# Patient Record
Sex: Female | Born: 1960 | Race: White | Hispanic: No | Marital: Married | Smoking: Never smoker
Health system: Southern US, Community
[De-identification: ages and names within clinical notes are randomized; demographics above are authoritative.]

## PROBLEM LIST (undated history)

## (undated) DIAGNOSIS — K5190 Ulcerative colitis, unspecified, without complications: Secondary | ICD-10-CM

## (undated) DIAGNOSIS — K8309 Other cholangitis: Secondary | ICD-10-CM

## (undated) HISTORY — PX: TRACHEOSTOMY: SUR1362

## (undated) HISTORY — PX: FEMUR FRACTURE SURGERY: SHX633

## (undated) HISTORY — PX: WRIST SURGERY: SHX841

## (undated) HISTORY — PX: TOTAL HIP ARTHROPLASTY: SHX124

---

## 2009-02-10 ENCOUNTER — Encounter: Payer: Self-pay | Admitting: Family Medicine

## 2009-02-26 ENCOUNTER — Encounter: Payer: Self-pay | Admitting: Family Medicine

## 2009-03-28 ENCOUNTER — Encounter: Payer: Self-pay | Admitting: Family Medicine

## 2009-05-01 ENCOUNTER — Ambulatory Visit: Payer: Self-pay

## 2010-04-01 ENCOUNTER — Emergency Department: Payer: Self-pay | Admitting: Emergency Medicine

## 2015-07-22 DIAGNOSIS — K7460 Unspecified cirrhosis of liver: Secondary | ICD-10-CM | POA: Diagnosis not present

## 2015-07-22 DIAGNOSIS — K830 Cholangitis: Secondary | ICD-10-CM | POA: Diagnosis not present

## 2015-07-22 DIAGNOSIS — K766 Portal hypertension: Secondary | ICD-10-CM | POA: Diagnosis not present

## 2015-09-16 DIAGNOSIS — K3189 Other diseases of stomach and duodenum: Secondary | ICD-10-CM | POA: Diagnosis not present

## 2015-09-16 DIAGNOSIS — I8500 Esophageal varices without bleeding: Secondary | ICD-10-CM | POA: Diagnosis not present

## 2015-09-16 DIAGNOSIS — K5150 Left sided colitis without complications: Secondary | ICD-10-CM | POA: Diagnosis not present

## 2015-09-16 DIAGNOSIS — Z1211 Encounter for screening for malignant neoplasm of colon: Secondary | ICD-10-CM | POA: Diagnosis not present

## 2015-09-16 DIAGNOSIS — K51918 Ulcerative colitis, unspecified with other complication: Secondary | ICD-10-CM | POA: Diagnosis not present

## 2015-10-28 DIAGNOSIS — K3189 Other diseases of stomach and duodenum: Secondary | ICD-10-CM | POA: Diagnosis not present

## 2015-10-28 DIAGNOSIS — I8500 Esophageal varices without bleeding: Secondary | ICD-10-CM | POA: Diagnosis not present

## 2015-11-19 DIAGNOSIS — K830 Cholangitis: Secondary | ICD-10-CM | POA: Diagnosis not present

## 2015-11-19 DIAGNOSIS — T380X1A Poisoning by glucocorticoids and synthetic analogues, accidental (unintentional), initial encounter: Secondary | ICD-10-CM | POA: Diagnosis not present

## 2015-11-19 DIAGNOSIS — I8500 Esophageal varices without bleeding: Secondary | ICD-10-CM | POA: Diagnosis not present

## 2015-11-19 DIAGNOSIS — M818 Other osteoporosis without current pathological fracture: Secondary | ICD-10-CM | POA: Diagnosis not present

## 2016-01-27 DIAGNOSIS — K7460 Unspecified cirrhosis of liver: Secondary | ICD-10-CM | POA: Diagnosis not present

## 2016-01-27 DIAGNOSIS — K228 Other specified diseases of esophagus: Secondary | ICD-10-CM | POA: Diagnosis not present

## 2016-01-27 DIAGNOSIS — Z96643 Presence of artificial hip joint, bilateral: Secondary | ICD-10-CM | POA: Diagnosis not present

## 2016-01-27 DIAGNOSIS — R1310 Dysphagia, unspecified: Secondary | ICD-10-CM | POA: Diagnosis not present

## 2016-01-27 DIAGNOSIS — I8500 Esophageal varices without bleeding: Secondary | ICD-10-CM | POA: Diagnosis not present

## 2016-01-27 DIAGNOSIS — Z8719 Personal history of other diseases of the digestive system: Secondary | ICD-10-CM | POA: Diagnosis not present

## 2016-01-27 DIAGNOSIS — K3189 Other diseases of stomach and duodenum: Secondary | ICD-10-CM | POA: Diagnosis not present

## 2016-01-27 DIAGNOSIS — K766 Portal hypertension: Secondary | ICD-10-CM | POA: Diagnosis not present

## 2016-03-02 DIAGNOSIS — M1990 Unspecified osteoarthritis, unspecified site: Secondary | ICD-10-CM | POA: Diagnosis not present

## 2016-03-02 DIAGNOSIS — I10 Essential (primary) hypertension: Secondary | ICD-10-CM | POA: Diagnosis not present

## 2016-03-02 DIAGNOSIS — L989 Disorder of the skin and subcutaneous tissue, unspecified: Secondary | ICD-10-CM | POA: Diagnosis not present

## 2016-04-22 DIAGNOSIS — Z8781 Personal history of (healed) traumatic fracture: Secondary | ICD-10-CM | POA: Diagnosis not present

## 2016-04-22 DIAGNOSIS — Z92241 Personal history of systemic steroid therapy: Secondary | ICD-10-CM | POA: Diagnosis not present

## 2016-04-22 DIAGNOSIS — M818 Other osteoporosis without current pathological fracture: Secondary | ICD-10-CM | POA: Diagnosis not present

## 2016-07-06 DIAGNOSIS — M818 Other osteoporosis without current pathological fracture: Secondary | ICD-10-CM | POA: Diagnosis not present

## 2016-07-28 DIAGNOSIS — H5213 Myopia, bilateral: Secondary | ICD-10-CM | POA: Diagnosis not present

## 2017-10-20 ENCOUNTER — Other Ambulatory Visit: Payer: Self-pay | Admitting: Family Medicine

## 2017-10-20 DIAGNOSIS — Z1231 Encounter for screening mammogram for malignant neoplasm of breast: Secondary | ICD-10-CM

## 2017-11-15 ENCOUNTER — Ambulatory Visit
Admission: RE | Admit: 2017-11-15 | Discharge: 2017-11-15 | Disposition: A | Discharge status: Home / Self Care | Payer: Commercial Managed Care - PPO | Source: Ambulatory Visit | Attending: Family Medicine | Admitting: Family Medicine

## 2017-11-15 ENCOUNTER — Encounter: Payer: Self-pay | Admitting: Radiology

## 2017-11-15 DIAGNOSIS — Z1231 Encounter for screening mammogram for malignant neoplasm of breast: Secondary | ICD-10-CM | POA: Diagnosis present

## 2021-03-25 ENCOUNTER — Other Ambulatory Visit: Payer: Self-pay | Admitting: Family Medicine

## 2021-03-25 DIAGNOSIS — Z1231 Encounter for screening mammogram for malignant neoplasm of breast: Secondary | ICD-10-CM

## 2021-04-28 ENCOUNTER — Ambulatory Visit
Admission: RE | Admit: 2021-04-28 | Discharge: 2021-04-28 | Disposition: A | Discharge status: Home / Self Care | Payer: Commercial Managed Care - PPO | Source: Ambulatory Visit | Attending: Family Medicine | Admitting: Family Medicine

## 2021-04-28 ENCOUNTER — Other Ambulatory Visit: Payer: Self-pay

## 2021-04-28 DIAGNOSIS — Z1231 Encounter for screening mammogram for malignant neoplasm of breast: Secondary | ICD-10-CM | POA: Diagnosis not present

## 2021-08-19 ENCOUNTER — Emergency Department
Admission: EM | Admit: 2021-08-19 | Discharge: 2021-08-19 | Disposition: A | Discharge status: Home / Self Care | Payer: Commercial Managed Care - PPO | Attending: Emergency Medicine | Admitting: Emergency Medicine

## 2021-08-19 ENCOUNTER — Emergency Department: Payer: Commercial Managed Care - PPO

## 2021-08-19 ENCOUNTER — Other Ambulatory Visit: Payer: Self-pay

## 2021-08-19 ENCOUNTER — Encounter: Payer: Self-pay | Admitting: Emergency Medicine

## 2021-08-19 DIAGNOSIS — I10 Essential (primary) hypertension: Secondary | ICD-10-CM | POA: Diagnosis not present

## 2021-08-19 DIAGNOSIS — R002 Palpitations: Secondary | ICD-10-CM | POA: Diagnosis present

## 2021-08-19 DIAGNOSIS — I4891 Unspecified atrial fibrillation: Secondary | ICD-10-CM | POA: Insufficient documentation

## 2021-08-19 HISTORY — DX: Ulcerative colitis, unspecified, without complications: K51.90

## 2021-08-19 HISTORY — DX: Other cholangitis: K83.09

## 2021-08-19 LAB — CBC
HCT: 39.9 % (ref 36.0–46.0)
Hemoglobin: 12.9 g/dL (ref 12.0–15.0)
MCH: 33.3 pg (ref 26.0–34.0)
MCHC: 32.3 g/dL (ref 30.0–36.0)
MCV: 103.1 fL — ABNORMAL HIGH (ref 80.0–100.0)
Platelets: 117 10*3/uL — ABNORMAL LOW (ref 150–400)
RBC: 3.87 MIL/uL (ref 3.87–5.11)
RDW: 13 % (ref 11.5–15.5)
WBC: 5.5 10*3/uL (ref 4.0–10.5)
nRBC: 0 % (ref 0.0–0.2)

## 2021-08-19 LAB — TROPONIN I (HIGH SENSITIVITY): Troponin I (High Sensitivity): 9 ng/L (ref <18)

## 2021-08-19 LAB — BASIC METABOLIC PANEL
Anion gap: 8 (ref 5–15)
BUN: 18 mg/dL (ref 6–20)
CO2: 24 mmol/L (ref 22–32)
Calcium: 8.8 mg/dL — ABNORMAL LOW (ref 8.9–10.3)
Chloride: 104 mmol/L (ref 98–111)
Creatinine, Ser: 0.75 mg/dL (ref 0.44–1.00)
GFR, Estimated: >60 mL/min (ref >60)
Glucose, Bld: 83 mg/dL (ref 70–99)
Potassium: 4.1 mmol/L (ref 3.5–5.1)
Sodium: 136 mmol/L (ref 135–145)

## 2021-08-19 MED ORDER — NADOLOL 40 MG PO TABS: 40.0000 mg | ORAL_TABLET | Freq: Two times a day (BID) | ORAL | 2 refills | Status: AC

## 2021-08-19 MED ORDER — SODIUM CHLORIDE 0.9 % IV BOLUS
1000.0000 mL | Freq: Once | INTRAVENOUS | Status: AC
Start: 2021-08-19 — End: 2021-08-19
  Administered 2021-08-19: 1000 mL via INTRAVENOUS

## 2021-08-19 MED ORDER — DILTIAZEM HCL 25 MG/5ML IV SOLN
10.0000 mg | Freq: Once | INTRAVENOUS | Status: AC
  Administered 2021-08-19: 10 mg via INTRAVENOUS
  Filled 2021-08-19: qty 5

## 2021-08-19 MED ORDER — APIXABAN 2.5 MG PO TABS: 2.5000 mg | ORAL_TABLET | Freq: Two times a day (BID) | ORAL | 2 refills | Status: AC

## 2021-08-19 MED ORDER — DILTIAZEM HCL ER COATED BEADS 180 MG PO CP24
180.0000 mg | ORAL_CAPSULE | Freq: Once | ORAL | Status: AC
  Administered 2021-08-19: 180 mg via ORAL
  Filled 2021-08-19: qty 1

## 2021-08-19 NOTE — ED Notes (Signed)
C/o some dizziness, heart racing and som sob. Denies pain or nausea.

## 2021-08-19 NOTE — ED Provider Notes (Addendum)
[facility] Provider Note    Event Date/Time   First MD Initiated Contact with Patient 08/19/21 1331     (approximate)  History   Chief Complaint: Irregular Heart Beat and Dizziness  HPI  Morgan Preston is a 61 y.o. female with no significant past medical history presents to the emergency department for palpitations and dizziness.  According to the patient since last night she has been feeling palpitations like her heart is racing in the chest.  States she has been feeling dizzy at times as well as a sensation of slight chest tightness.  Patient denies any history of irregular heartbeat in the past.  States she used to have palpitations in approximately 2 years ago had a work-up by Dr. Lady Gary of cardiology for the same with a Holter that did not show any abnormalities.  Patient states very slight chest tightness.  She is quite hypertensive 160/133 but appears quite anxious as well.  Physical Exam   Triage Vital Signs: ED Triage Vitals  Enc Vitals Group     BP 08/19/21 1313 (!) 161/133     Pulse Rate 08/19/21 1313 (!) 122     Resp 08/19/21 1313 17     Temp 08/19/21 1313 98.3 F (36.8 C)     Temp Source 08/19/21 1313 Oral     SpO2 08/19/21 1313 100 %     Weight 08/19/21 1309 162 lb (73.5 kg)     Height 08/19/21 1309 5\' 1"  (1.549 m)     Head Circumference --      Peak Flow --      Pain Score 08/19/21 1309 0     Pain Loc --      Pain Edu? --      Excl. in GC? --     Most recent vital signs: Vitals:   08/19/21 1313  BP: (!) 161/133  Pulse: (!) 122  Resp: 17  Temp: 98.3 F (36.8 C)  SpO2: 100%    General: Awake, no distress.  CV:  Good peripheral perfusion.  Irregular rhythm rate around 130 bpm. Resp:  Normal effort.  Equal breath sounds bilaterally.  Abd:  No distention.  Soft, nontender.  No rebound or guarding.    ED Results / Procedures / Treatments   EKG  EKG viewed and interpreted by myself shows atrial fibrillation at 119 bpm with  a narrow QRS, normal axis, normal intervals, no concerning ST changes.  RADIOLOGY  I personally reviewed the chest x-ray images, no significant abnormality on my evaluation. Chest x-ray read as cardiomegaly otherwise negative.   MEDICATIONS ORDERED IN ED: Medications  diltiazem (CARDIZEM) injection 10 mg (has no administration in time range)  sodium chloride 0.9 % bolus 1,000 mL (has no administration in time range)     IMPRESSION / MDM / ASSESSMENT AND PLAN / ED COURSE  I reviewed the triage vital signs and the nursing notes.  Patient presents to the emergency department for palpitations and chest tightness as well as dizziness.  Overall the patient appears well she does appear to be in atrial fibrillation around 130 bpm.  Given the patient's new onset atrial fibrillation we will dose IV fluids, 10 mg IV diltiazem.  We will check labs including cardiac enzymes and continue to closely monitor.  Patient agreeable to plan of care.  Patient's labs are reassuring.  CBC is normal.  Chemistry is normal.  Troponin is negative.  Patient's heart rate has come down from 120/130 currently around 85  to 95 bpm.  Given the patient's reassuring work-up and good response to diltiazem I spoke to Dr. Juliann Pares.  He is agreeable with plan to discharge home on Eliquis and diltiazem and have the patient follow-up in the office.  I spoke to the patient regarding this she is agreeable to this plan of care as well.  I also discussed blood thinner return precautions.  Patient has a history of primary sclerosing cholangitis.  Is on nadolol already.  Given this I spoke to Dr. Juliann Pares again.  We will hold off on diltiazem.  We will start the patient on a lower dose Eliquis 2.5 mg twice daily.  We will increase the nadolol from 40 mg daily to 40 mg twice daily have the patient follow-up with her doctor at [facility] as well.  Patient agreeable to plan.  FINAL CLINICAL IMPRESSION(S) / ED DIAGNOSES   New onset atrial  fibrillation Atrial fibrillation with rapid ventricular response  Rx / DC Orders   Nadolol Eliquis  Note:  This document was prepared using Dragon voice recognition software and may include unintentional dictation errors.   Minna Antis, MD 08/19/21 1521    Minna Antis, MD 08/19/21 [phone]

## 2021-08-19 NOTE — ED Triage Notes (Signed)
Pt comes into the ED via [facility] clinic for irregular heart rate, dizziness, and palpitations that started last night.  Pt denies any known h/o afib.  Pt rate with KC was between 90-120.  Pt also presented HTN at [facility] with it being 195/125.  Pt denies any chest pain or SHOB.  Pt denies any blood thinner use.  Pt in NAD at this time with even and unlabored respirations.

## 2021-08-19 NOTE — ED Notes (Signed)
D/c pending cards consult

## 2021-08-19 NOTE — ED Notes (Signed)
Back from [facility], family at [facility], EDP finished at [facility]. No changes. Pt alert, NAD, calm, interactive.

## 2021-08-19 NOTE — ED Notes (Addendum)
Denies questions or needs, up to [facility] prior to leaving

## 2022-03-29 ENCOUNTER — Other Ambulatory Visit: Payer: Self-pay | Admitting: Family Medicine

## 2022-03-29 DIAGNOSIS — Z1231 Encounter for screening mammogram for malignant neoplasm of breast: Secondary | ICD-10-CM

## 2022-08-03 ENCOUNTER — Ambulatory Visit
Admission: RE | Admit: 2022-08-03 | Discharge: 2022-08-03 | Disposition: A | Discharge status: Home / Self Care | Payer: Commercial Managed Care - PPO | Source: Ambulatory Visit | Attending: Family Medicine | Admitting: Family Medicine

## 2022-08-03 DIAGNOSIS — Z1231 Encounter for screening mammogram for malignant neoplasm of breast: Secondary | ICD-10-CM | POA: Diagnosis not present

## 2022-10-11 IMAGING — MG MM DIGITAL SCREENING BILAT W/ TOMO AND CAD
8 series · 9 of 24 positions shown · non-contrast
Comparison: Previous exam(s).

CLINICAL DATA: Screening.

EXAM:
DIGITAL SCREENING BILATERAL MAMMOGRAM WITH TOMOSYNTHESIS AND CAD
TECHNIQUE: Bilateral screening digital craniocaudal and mediolateral oblique
mammograms were obtained. Bilateral screening digital breast
tomosynthesis was performed. The images were evaluated with
computer-aided detection.

[R CC synth-2D]
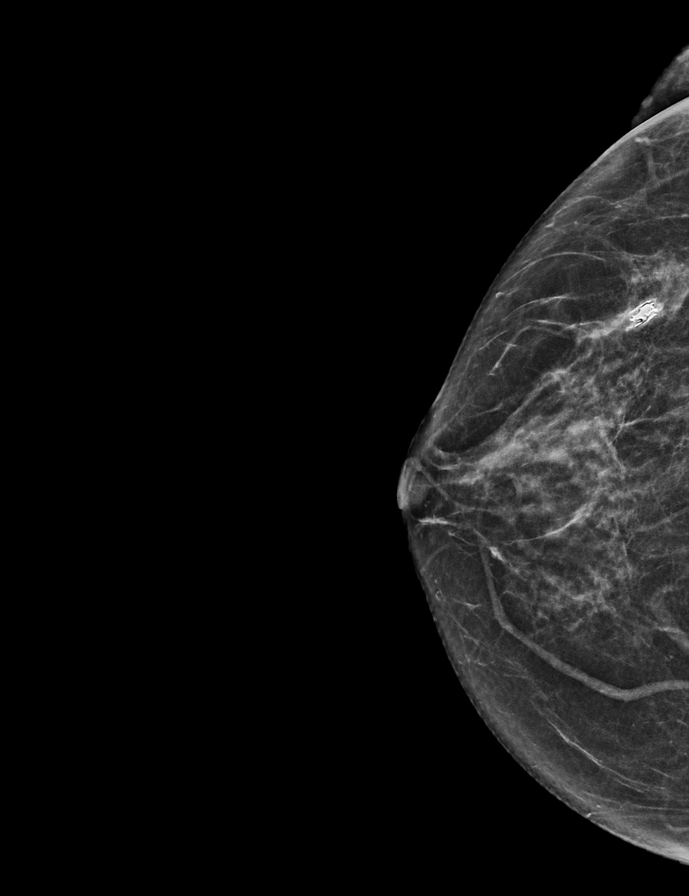

[L CC synth-2D]
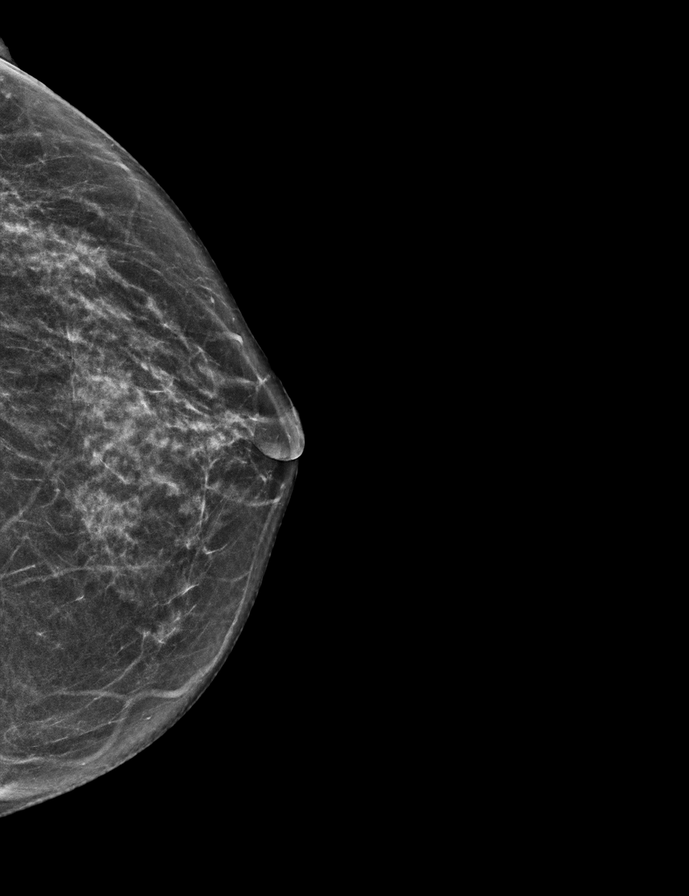

[R MLO synth-2D]
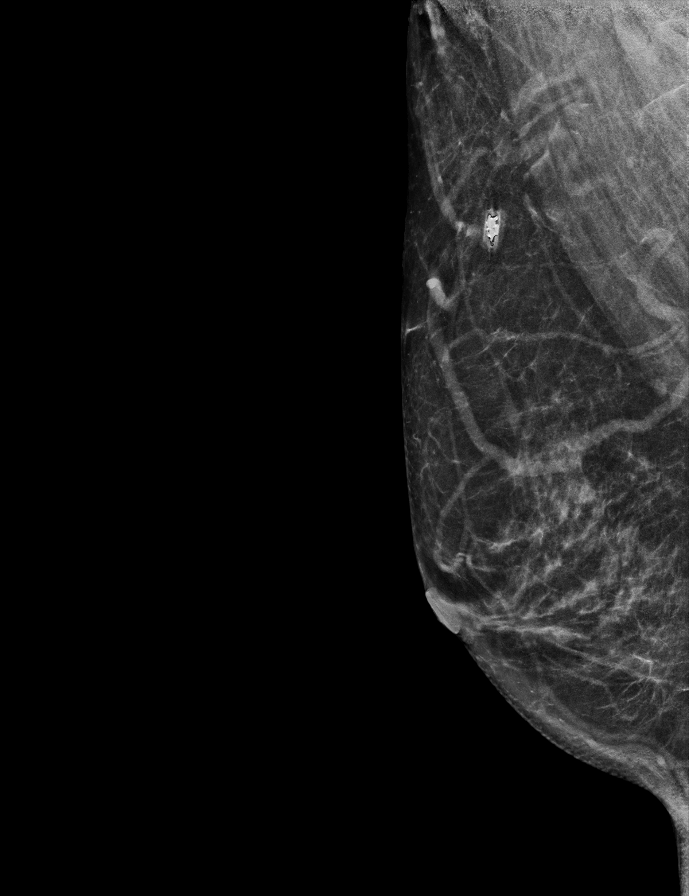

[L MLO synth-2D]
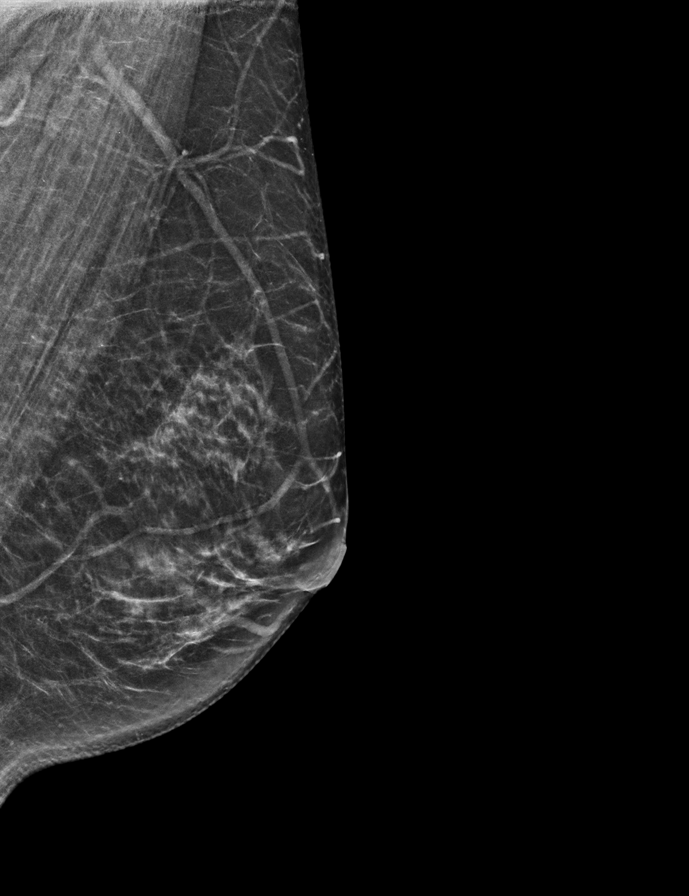

[L CC tomo · 2 of 56 frames shown]
[frame 19/56]
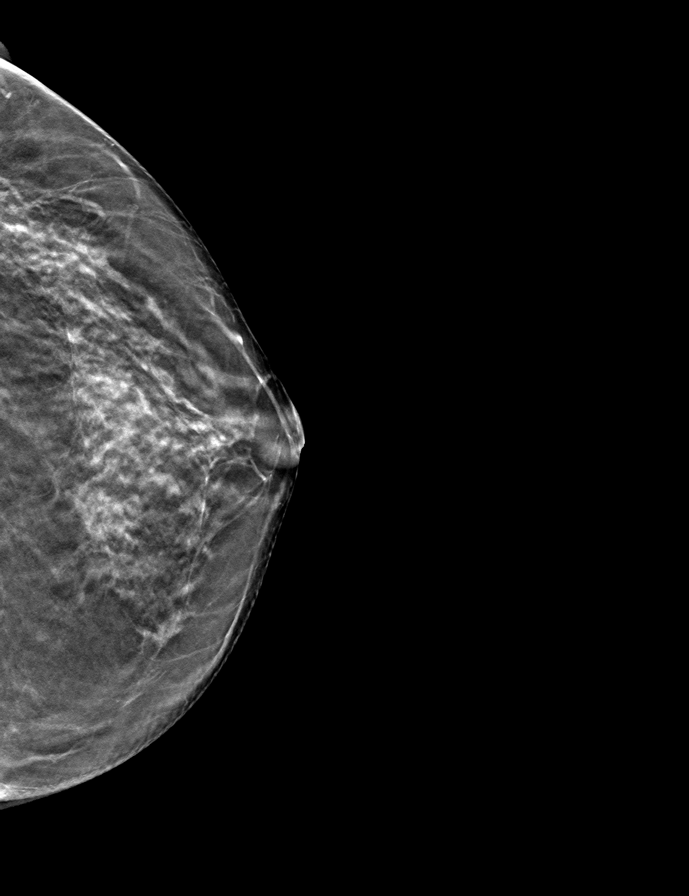
[frame 29/56]
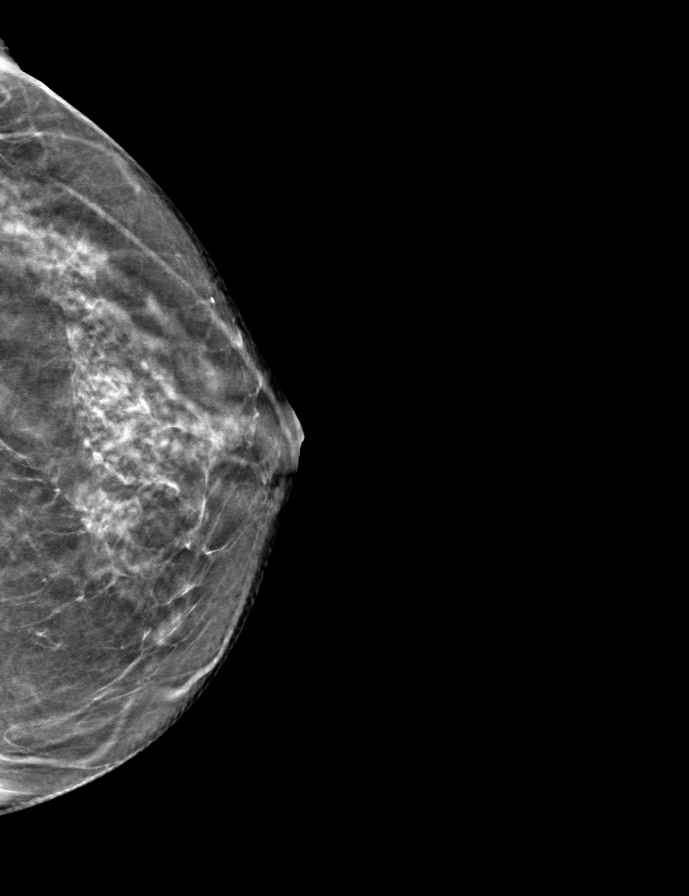

[R CC tomo · tomo slice 30/59.0]
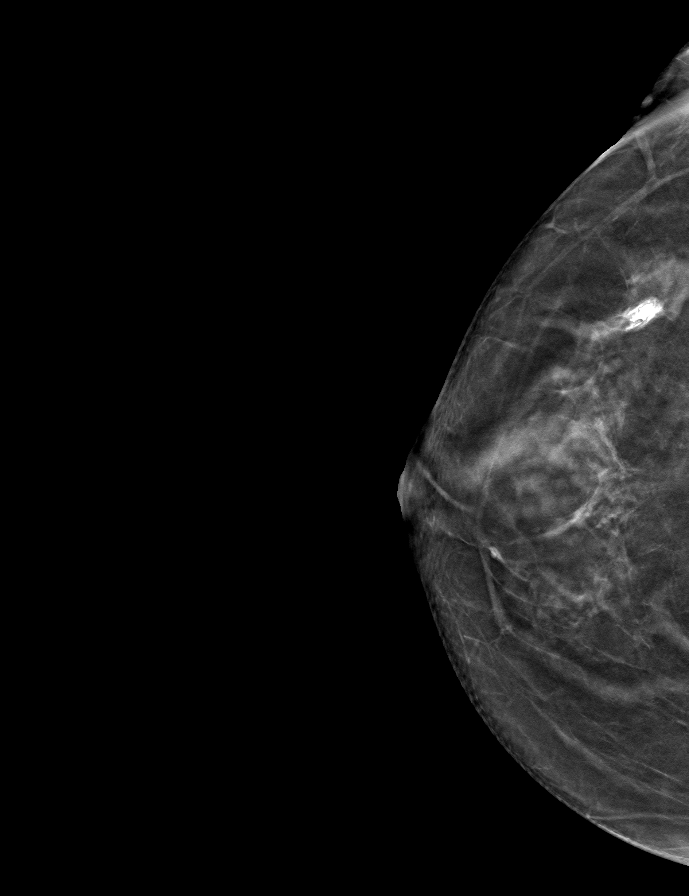

[L MLO tomo · tomo slice 29/58.0]
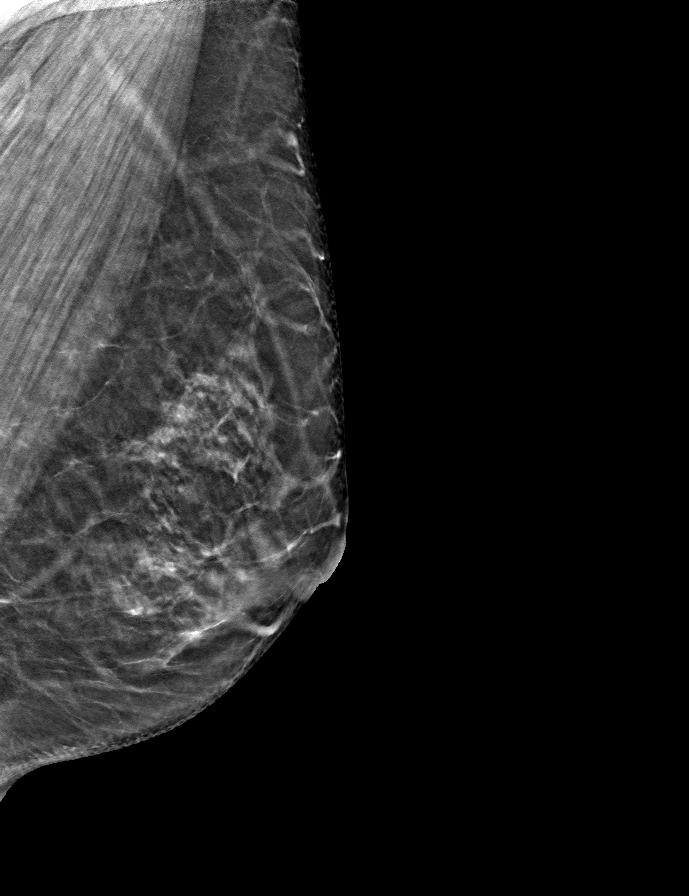

[R MLO tomo · tomo slice 32/63.0]
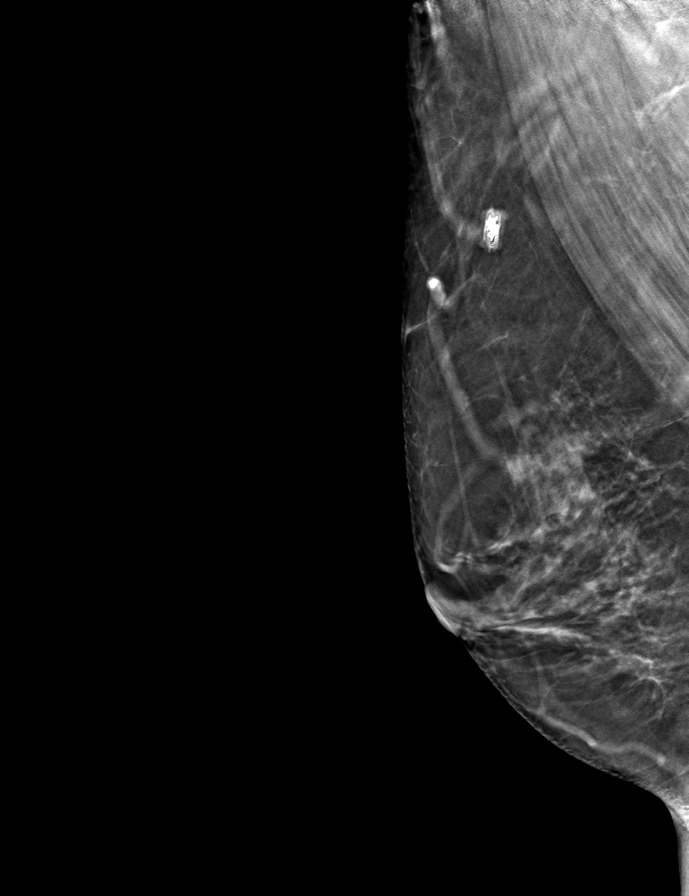

[9 of 24 positions shown; findings below may reference images not displayed]

ACR Breast Density Category b: There are scattered areas of
fibroglandular density.
FINDINGS: There are no findings suspicious for malignancy.
IMPRESSION: No mammographic evidence of malignancy. A result letter of this
screening mammogram will be mailed directly to the patient.

RECOMMENDATION:
Screening mammogram in one year. (Code:51-O-LD2)

BI-RADS CATEGORY  1: Negative.

## 2023-02-01 IMAGING — DX DG CHEST 1V PORT
1 series · 1 of 1 positions shown · non-contrast
Comparison: No pertinent prior exams available for comparison.

CLINICAL DATA: Provided history: Atrial fibrillation. Additional
history provided: Irregular heart rate, dizziness, palpitations.

EXAM:
PORTABLE CHEST 1 VIEW

[chest ap]
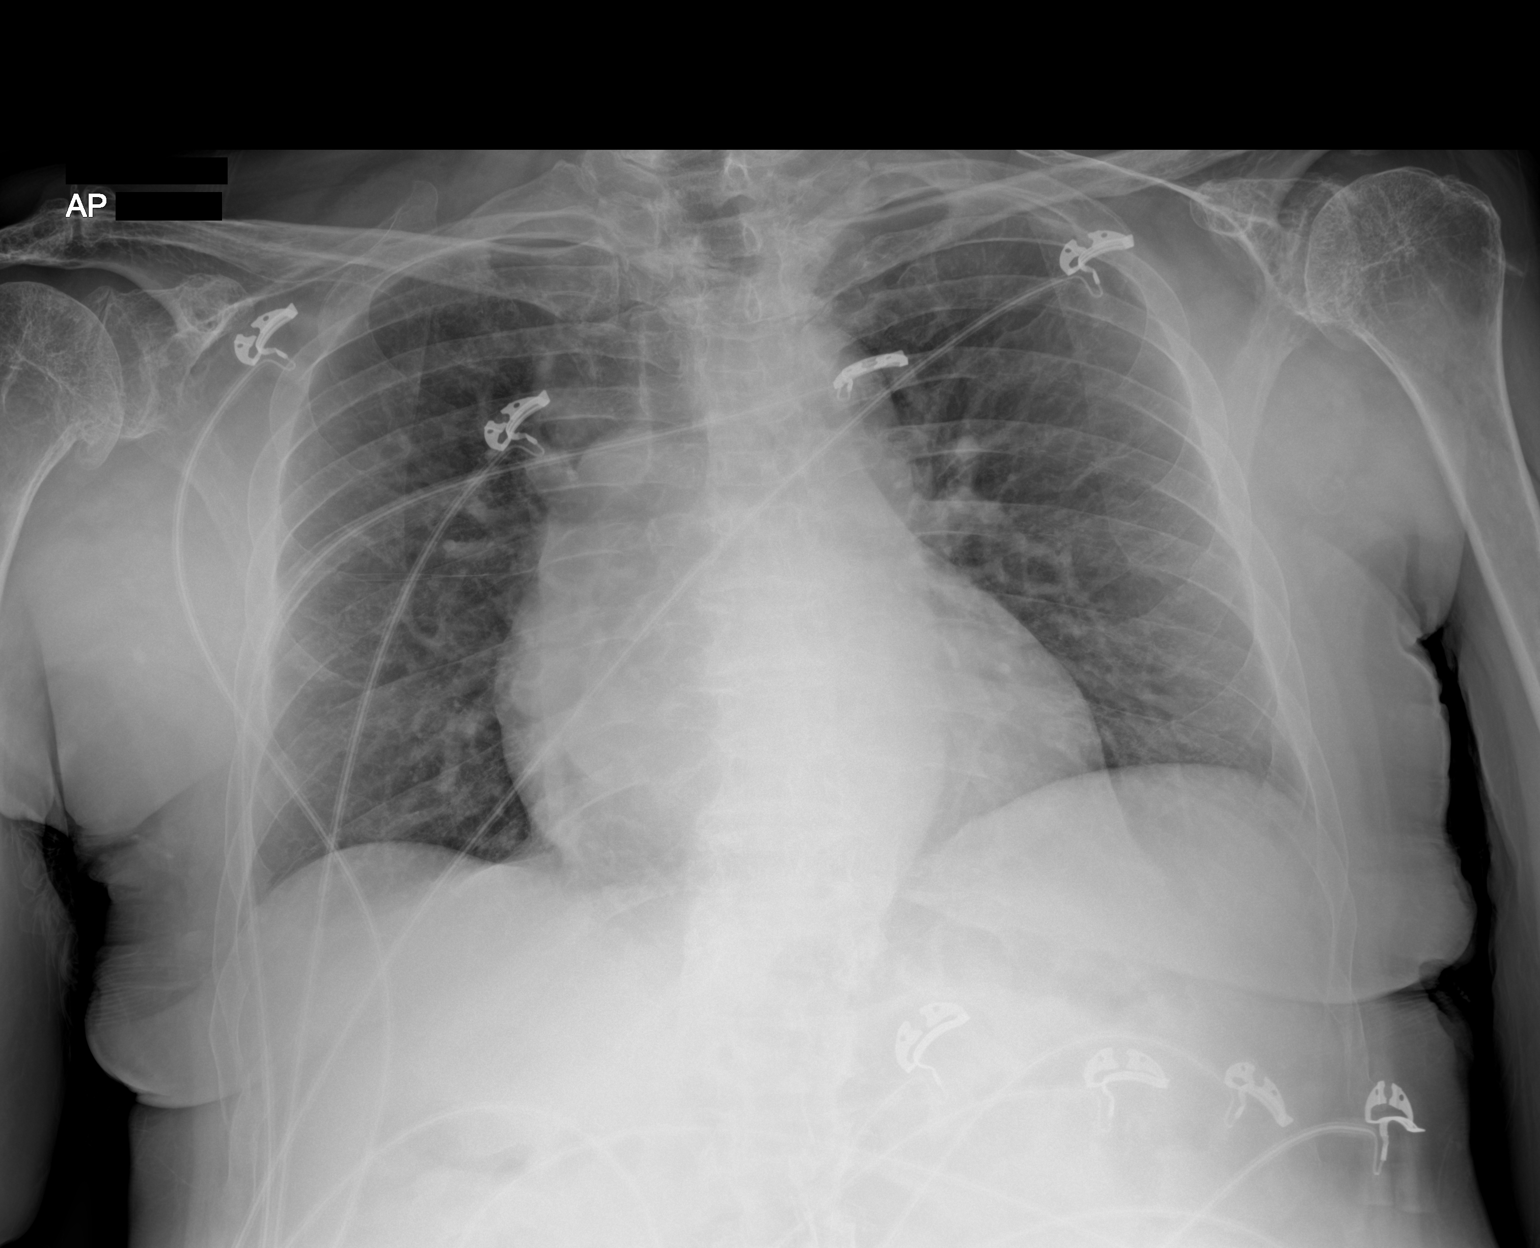

[1 of 1 positions shown; findings below may reference images not displayed]

FINDINGS: Cardiomegaly. No appreciable airspace consolidation. No evidence of
pleural effusion or pneumothorax. No acute bony abnormality
identified. Mild thoracic levocurvature.
IMPRESSION: Cardiomegaly.

No appreciable airspace consolidation or pulmonary edema.

## 2024-04-16 ENCOUNTER — Other Ambulatory Visit: Payer: Self-pay | Admitting: Family Medicine

## 2024-04-16 DIAGNOSIS — Z1231 Encounter for screening mammogram for malignant neoplasm of breast: Secondary | ICD-10-CM

## 2024-05-17 ENCOUNTER — Ambulatory Visit
Admission: RE | Admit: 2024-05-17 | Discharge: 2024-05-17 | Disposition: A | Discharge status: Home / Self Care | Source: Ambulatory Visit | Attending: Family Medicine | Admitting: Family Medicine

## 2024-05-17 DIAGNOSIS — Z1231 Encounter for screening mammogram for malignant neoplasm of breast: Secondary | ICD-10-CM | POA: Diagnosis present
